# Patient Record
Sex: Male | Born: 1945 | Race: White | Hispanic: No | Marital: Married | Smoking: Former smoker
Health system: Southern US, Community
[De-identification: ages and names within clinical notes are randomized; demographics above are authoritative.]

## PROBLEM LIST (undated history)

## (undated) DIAGNOSIS — I10 Essential (primary) hypertension: Secondary | ICD-10-CM

## (undated) DIAGNOSIS — E119 Type 2 diabetes mellitus without complications: Secondary | ICD-10-CM

## (undated) DIAGNOSIS — I219 Acute myocardial infarction, unspecified: Secondary | ICD-10-CM

## (undated) DIAGNOSIS — G629 Polyneuropathy, unspecified: Secondary | ICD-10-CM

## (undated) HISTORY — PX: CORONARY ANGIOPLASTY WITH STENT PLACEMENT: SHX49

## (undated) HISTORY — PX: ROTATOR CUFF REPAIR: SHX139

## (undated) HISTORY — PX: BACK SURGERY: SHX140

---

## 2008-12-06 ENCOUNTER — Emergency Department (HOSPITAL_COMMUNITY): Admission: EM | Admit: 2008-12-06 | Discharge: 2008-12-06 | Payer: Self-pay | Admitting: Emergency Medicine

## 2008-12-06 ENCOUNTER — Ambulatory Visit (HOSPITAL_COMMUNITY): Admission: RE | Admit: 2008-12-06 | Discharge: 2008-12-06 | Payer: Self-pay | Admitting: Orthopedic Surgery

## 2008-12-27 ENCOUNTER — Inpatient Hospital Stay (HOSPITAL_COMMUNITY): Admission: RE | Admit: 2008-12-27 | Discharge: 2009-01-01 | Payer: Self-pay | Admitting: Orthopedic Surgery

## 2010-02-02 IMAGING — CR DG LUMBAR SPINE 2-3V
1 series · 1 of 1 positions shown · non-contrast
Comparison: 11/29/2008

CLINICAL DATA: Lumbar spinal stenosis, L3-L5 decompression

LUMBAR SPINE - 2-3 VIEW

[view not recorded]
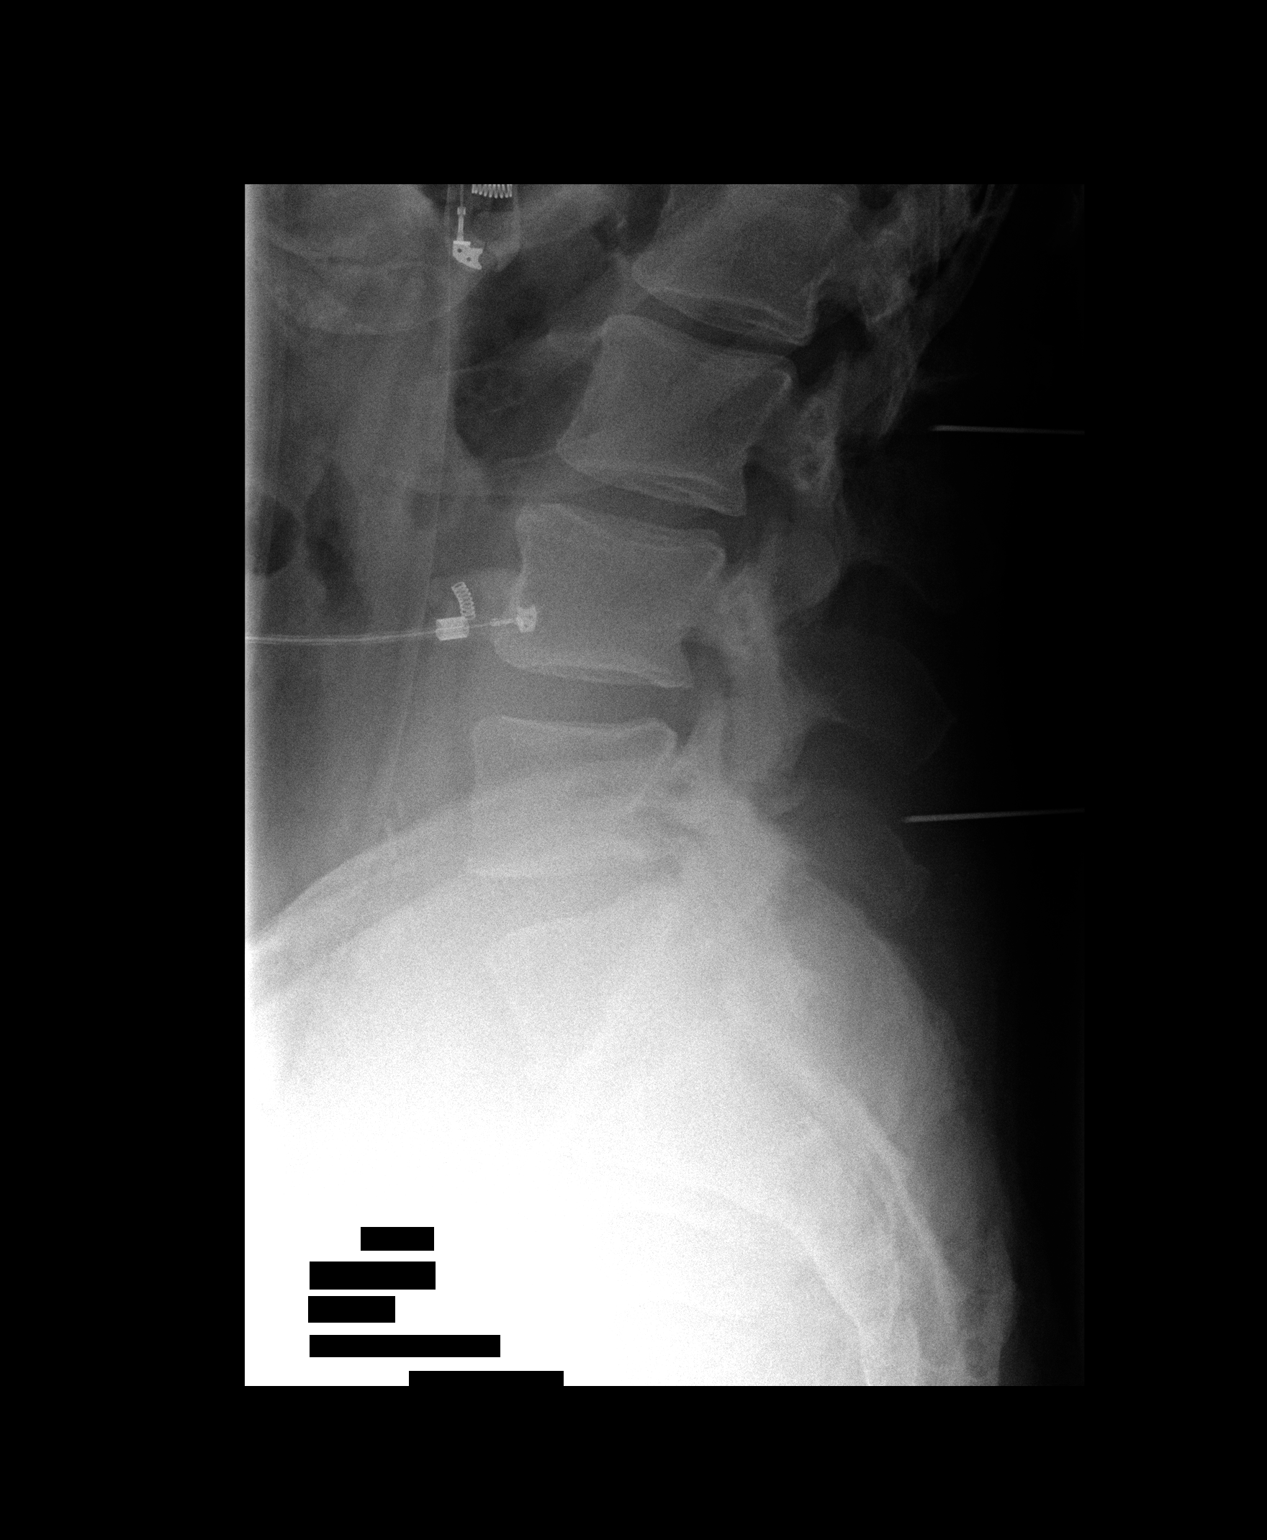

[1 of 1 positions shown; findings below may reference images not displayed]

FINDINGS: Cross-table lateral intraoperative views were obtained.
Film #1 demonstrates radiopaque localizer needles posterior to L2
and L4.  Film #2 demonstrates a surgical gauze posterior to L4-5
and a radiopaque localizer posterior to L3-4
IMPRESSION: Lumbar spine intraoperative localization

## 2010-10-13 LAB — TYPE AND SCREEN: ABO/RH(D): B POS

## 2010-10-13 LAB — GLUCOSE, CAPILLARY
Glucose-Capillary: 100 mg/dL — ABNORMAL HIGH (ref 70–99)
Glucose-Capillary: 100 mg/dL — ABNORMAL HIGH (ref 70–99)
Glucose-Capillary: 107 mg/dL — ABNORMAL HIGH (ref 70–99)
Glucose-Capillary: 107 mg/dL — ABNORMAL HIGH (ref 70–99)
Glucose-Capillary: 115 mg/dL — ABNORMAL HIGH (ref 70–99)
Glucose-Capillary: 120 mg/dL — ABNORMAL HIGH (ref 70–99)
Glucose-Capillary: 127 mg/dL — ABNORMAL HIGH (ref 70–99)
Glucose-Capillary: 137 mg/dL — ABNORMAL HIGH (ref 70–99)
Glucose-Capillary: 97 mg/dL (ref 70–99)
Glucose-Capillary: 99 mg/dL (ref 70–99)
Glucose-Capillary: 99 mg/dL (ref 70–99)

## 2010-10-13 LAB — CBC
HCT: 40.6 % (ref 39.0–52.0)
Hemoglobin: 13.5 g/dL (ref 13.0–17.0)
MCV: 88 fL (ref 78.0–100.0)
Platelets: 186 10*3/uL (ref 150–400)
WBC: 5.7 10*3/uL (ref 4.0–10.5)

## 2010-10-13 LAB — BASIC METABOLIC PANEL
BUN: 11 mg/dL (ref 6–23)
Chloride: 110 mEq/L (ref 96–112)
Glucose, Bld: 106 mg/dL — ABNORMAL HIGH (ref 70–99)
Potassium: 4.4 mEq/L (ref 3.5–5.1)
Sodium: 142 mEq/L (ref 135–145)

## 2010-10-13 LAB — ABO/RH: ABO/RH(D): B POS

## 2010-10-14 LAB — BASIC METABOLIC PANEL
BUN: 11 mg/dL (ref 6–23)
CO2: 29 mEq/L (ref 19–32)
Chloride: 105 mEq/L (ref 96–112)
Creatinine, Ser: 0.95 mg/dL (ref 0.4–1.5)
Glucose, Bld: 97 mg/dL (ref 70–99)

## 2010-10-14 LAB — CBC
HCT: 41.2 % (ref 39.0–52.0)
Hemoglobin: 13.9 g/dL (ref 13.0–17.0)
MCHC: 33.7 g/dL (ref 30.0–36.0)
MCV: 86.8 fL (ref 78.0–100.0)
Platelets: 187 10*3/uL (ref 150–400)
RBC: 4.74 MIL/uL (ref 4.22–5.81)
RDW: 14.3 % (ref 11.5–15.5)
WBC: 6.5 10*3/uL (ref 4.0–10.5)

## 2010-11-18 NOTE — Op Note (Signed)
Patrick Berger, Patrick Berger                  ACCOUNT NO.:  [id number]   MEDICAL RECORD NO.:  [id number]          PATIENT TYPE:  INP   LOCATION:  5008                         FACILITY:  [facility]   PHYSICIAN:  Alvy Beal, MD    DATE OF BIRTH:  1946-04-12   DATE OF PROCEDURE:  12/27/2008  DATE OF DISCHARGE:                               OPERATIVE REPORT   PREOPERATIVE DIAGNOSIS:  Lumbar spinal stenosis with neurogenic  claudication, L3-4 and L4-5.   POSTOPERATIVE DIAGNOSIS:  Lumbar spinal stenosis with neurogenic  claudication, L3-4 and L4-5.   OPERATIVE PROCEDURE:  Lumbar 3 to lumbar 5 decompression and  foraminotomy with uninstrumented arthrodesis, L3 to 5.   COMPLICATIONS:  None.   CONDITION:  Stable.   SURGEON:  Dahari D. 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  Clinical  and radiographic analysis confirmed the diagnosis of lumber spinal  stenosis, specially in the lateral recess causing foraminal and nerve  root encroachment.  Attempts at conservative management have failed to  alleviate his symptoms.  As such surgical management was discussed.  All  appropriate risks, benefits, and alternatives were reviewed and he  consented to the aforementioned procedure.   OPERATIVE NOTE:  The patient was brought to the operating room, placed  supine on the operating table.  After successful induction of general  anesthesia and endotracheal intubation, TEDs, SCD, and Foley were  inserted and the patient was turned prone onto a Wilson frame.   All bony prominences were well-padded.  Appropriate preoperative  surgical time-out was done confirming patient and procedure.  The back  was prepped and draped in a standard fashion.  Two needles were placed  and x-ray was taken to mark the skin incision.  A generous posterior  midline incision was made  starting about around the levels of inferior  aspect of L2 expanding down to S1.  Sharp dissection was carried out  through the adipose tissue down to the deep fascia.  Deep fascia was  sharply incised in line with skin incision and using a Bovie and Cobb  elevator, I stripped the paraspinal muscles to expose the entire L3, L4,  and L5 spinous processes and lamina.  Once I had bilateral exposure, I  then placed a Penfield 4 underneath the lamina of L3 and took a second  intraoperative x-ray.  This confirmed that I was at the appropriate  level.  Once this was confirmed, I then proceeded with my central  decompression.  I removed the entire spinous process of L5 and L4 and  the majority of that L3.  Using a double XL rongeur, I also began  resecting the lamina of L5 and L4.  Once I removed the majority of the  bone with the Leksell rongeur, I then turned my attention to the  curettes.   A fine curette was used to develop plane underneath the remaining  portion of the lamina.  A 0.5  x 0.5 neural patty was then placed in  order to protect the underlying thecal sac.  Using a 2 and 3 mm Kerrison  rongeur, I resected the remainder of the L5 lamina.  Once I had a  complete laminectomy, I then proceeded into the lateral gutter.  Since I  was standing on the right hand side of the patient, I was proceeding  into the left and looking down the contralateral side.  I resected all  the significant osteophytes from the facet complex until I could palpate  and visualize the medial wall pedicle.  I then proceeded down for  generous foraminotomy with my 2-mm Kerrison.  At this point, I then  continued to move proximally.  Again using a Penfield 4, I developed a  plane between the thecal sac and the ligamentum flavum.  Now, I passed a  neural patty in this plane to protect the thecal sac.  Using the 3-mm  Kerrison, I completed the L4 laminectomy.  I then carried my dissection  into the lateral recess.   There were significant lateral recess  stenosis, thickening of the ligamentum flavum.  I was able to use the  Penfields 4 to dissect and develop a plane between the thecal sac and  the degenerative bone spurs.  Using a 2 and 3-mm Kerrison, I resected  all these until I could see again the medial wall of the L4 pedicle.  I  then used a 2-mm Kerrison to perform a foraminotomy.  I then repeated  this procedure at the L3 level, but at this time, performing on the  partial laminotomy.  Once I had the lateral recess decompress from the  superior aspect of the L3 pedicle down through the L5 pedicle.  I was  pleased with the central and left lateral decompression.  I could  clearly and easily use a hockey-stick dural elevator and pass it along  the lateral recess superiorly, medially, and inferiorly about the L3, L4  and L5 neural foramen.  There was no undo tension or ongoing significant  neural compression.  At this point, I went to the contralateral side of  the table, and then began doing the same lateral recess decompression,  but this time on the opposite side.  I found similar findings of severe  spinal stenosis with lateral recess compromise to the bone spur  formation, all which was resected with the 2 and 3-mm Kerrison.  At this  point, once the decompression was completed, I had the Wilson frame  reduced down and the local kyphosis removed, so that I can ensure that  there was no undue tension.  Again even with the Wilson frame completely  flat, I was still able to freely pass the dural elevator superiorly and  along the lateral recess and out at the L3, L4, and L5 neural foramen  without difficulty.  At this point, I was concerned because of the  extensive decompression that was done.  I then dissected out over the 2-  3, 3-4, and 4-5 facet complexes to expose the L3, L4, and L5 transverse  process.  I decorticated them with high-speed bur and then using a local  bone that I had  harvested from the decompression mixed with Actifuse, I  performed a posterolateral arthrodesis.   At this point, I irrigated the wound copiously with normal saline and  checked again to ensure my decompression was adequate.  I then placed  thrombin-soaked Gelfoam patty over the exposed thecal sac  and I placed  the drain in deep fascia.  I then closed the deep fascia with  interrupted #1 Vicryl sutures, then a 0 running Vicryl sutures layer, a  2-0 Vicryl suture layer and then 3-0 Monocryl for the skin.  Steri-  Strips and a dry dressing were applied.  The patient was extubated and  transferred to PACU without incident.  At the end of the case, all  needle and sponge counts were correct.      Alvy Beal, MD  Electronically Signed     DDB/MEDQ  D:  12/27/2008  T:  12/28/2008  Job:  [id number]

## 2010-11-21 NOTE — Discharge Summary (Signed)
Patrick Berger, Patrick Berger                  ACCOUNT NO.:  [id number]   MEDICAL RECORD NO.:  [id number]          PATIENT TYPE:  INP   LOCATION:  5008                         FACILITY:  [facility]   PHYSICIAN:  Alvy Beal, MD    DATE OF BIRTH:  03-Nov-1945   DATE OF ADMISSION:  12/27/2008  DATE OF DISCHARGE:  01/01/2009                               DISCHARGE SUMMARY   ADMISSION DIAGNOSIS:  Lumbar spinal stenosis with neurogenic  claudication at L3-4 and L4-5.   DISCHARGE DIAGNOSIS:  Lumbar spinal stenosis with neurogenic  claudication at L3-4 and L4-5.   OPERATIVE PROCEDURE:  On December 27, 2008 was an L3-L5 lumbar decompression  and foraminotomy with un-instrumented arthrodesis of L3-L5.   CONSULTATIONS OBTAINED:  None.   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  Clinical and radiographic analysis confirmed the  diagnosis of a lumbar spinal stenosis specifically in the lateral recess  causing foraminal and nerve root encroachment.  All attempts at  conservative management have failed including injection therapy,  physical therapy and nonnarcotic and narcotic medications.  At that  point, a surgical management was discussed with the patient.  All  appropriate risks, benefits and alternatives were reviewed with the  patient and the patient did consent to a L3-L5 lumbar decompression.   HOSPITAL COURSE:  The patient's hospital course was approximately 5 days  in length.  The patient tolerated the procedure very well and was  subsequently transferred from the PACU up to the orthopedic floor in  stable condition.  Postoperatively day #1, the patient was making a  positive gain.  His pain was controlled on the PCA and he began to work  with physical therapy.  Postoperatively day #2, the patient was doing  well, however, he did have a mild low-grade fever of 100 degrees,  therefore, his discharge was held and he was begun on incentive  spirometry.  He still continued to make positive gains with physical  therapy.  He was ambulating well with a rolling walker.  He was  tolerating a regular diet and had no other complaints.  The patient  continued to make positive gains while in the hospital.  He was working  making improvement everyday with physical therapy.  Postoperatively day  #5, the patient was afebrile.  He was tolerating a regular diet.  He was  having no complaints of shortness of breath and/or chest pain.  His  abdomen was soft and nontender.  He was having regular bowel and bladder  movements.  All his compartments were soft and nontender.  Therefore, he  was deemed stable to be discharged home with [facility].   DISCHARGE MEDICATIONS:  The patient is being discharged home on his  current medications of:  1. Simvastatin 40 mg 1 tablet daily.  2. Metoprolol 50 mg 1 tablet daily.  3. Metformin 500 mg 1 tablet b.i.d.  4. Multivitamin daily.  5. Aspirin 81 mg.  6.  The patient is being discharged home on a new medication of      Percocet 10/325 one tablet p.o. q.6 h. p.r.n. pain as well as      Robaxin 500 mg 1 tablet p.o. every 8 hours as needed for pain.   DISCHARGE VITALS:  Temperature of 98.6, pulse of 76, blood pressure of  136/84 and he was satting 98% on room air.   DISCHARGE INSTRUCTIONS:  The patient was given a preprinted discharge  instruction form that went over with specifics what the patient can and  cannot do at home.  The patient is allowed to increase his activity  slowly.  We would like the patient walking everyday as much as  tolerated.  He may walk up stairs as needed.  He is to no lifting for 6  weeks and no driving for 6 weeks.  He may shower on postoperatively day  #5, needs to change his dressing daily for the next 7 days.  The patient  is instructed to follow up with Dr. Shon Baton in our office at [phone]  in  2 weeks for a suture removal.  The patient is to call our office at [phone] if he experiences any fevers, chills, redness or pain around the  incision site, he notes any drainage from the incision site, he has  increasing leg pain or loss of bowel or bladder function.  The patient  is being discharged home in stable condition with [facility].      Crissie Reese, PA      Alvy Beal, MD  Electronically Signed    AC/MEDQ  D:  02/18/2009  T:  02/19/2009  Job:  [id number]

## 2016-10-08 ENCOUNTER — Other Ambulatory Visit (HOSPITAL_COMMUNITY): Payer: Self-pay | Admitting: Interventional Radiology

## 2019-05-14 ENCOUNTER — Encounter (HOSPITAL_COMMUNITY): Payer: Self-pay | Admitting: Oncology

## 2019-05-14 ENCOUNTER — Emergency Department (HOSPITAL_COMMUNITY)
Admission: EM | Admit: 2019-05-14 | Discharge: 2019-05-14 | Disposition: A | Discharge status: Home / Self Care | Payer: No Typology Code available for payment source | Attending: Emergency Medicine | Admitting: Emergency Medicine

## 2019-05-14 ENCOUNTER — Other Ambulatory Visit: Payer: Self-pay

## 2019-05-14 ENCOUNTER — Emergency Department (HOSPITAL_COMMUNITY): Payer: No Typology Code available for payment source

## 2019-05-14 DIAGNOSIS — R4701 Aphasia: Secondary | ICD-10-CM | POA: Diagnosis not present

## 2019-05-14 DIAGNOSIS — Z87891 Personal history of nicotine dependence: Secondary | ICD-10-CM | POA: Insufficient documentation

## 2019-05-14 DIAGNOSIS — I10 Essential (primary) hypertension: Secondary | ICD-10-CM | POA: Diagnosis not present

## 2019-05-14 DIAGNOSIS — I639 Cerebral infarction, unspecified: Secondary | ICD-10-CM

## 2019-05-14 DIAGNOSIS — E119 Type 2 diabetes mellitus without complications: Secondary | ICD-10-CM | POA: Diagnosis not present

## 2019-05-14 HISTORY — DX: Polyneuropathy, unspecified: G62.9

## 2019-05-14 HISTORY — DX: Type 2 diabetes mellitus without complications: E11.9

## 2019-05-14 HISTORY — DX: Acute myocardial infarction, unspecified: I21.9

## 2019-05-14 HISTORY — DX: Essential (primary) hypertension: I10

## 2019-05-14 LAB — I-STAT CHEM 8, ED
BUN: 15 mg/dL (ref 8–23)
Calcium, Ion: 1.23 mmol/L (ref 1.15–1.40)
Chloride: 105 mmol/L (ref 98–111)
Creatinine, Ser: 1 mg/dL (ref 0.61–1.24)
Glucose, Bld: 125 mg/dL — ABNORMAL HIGH (ref 70–99)
HCT: 40 % (ref 39.0–52.0)
Hemoglobin: 13.6 g/dL (ref 13.0–17.0)
Potassium: 3.8 mmol/L (ref 3.5–5.1)
Sodium: 141 mmol/L (ref 135–145)
TCO2: 25 mmol/L (ref 22–32)

## 2019-05-14 LAB — CBC
HCT: 40.2 % (ref 39.0–52.0)
Hemoglobin: 13.2 g/dL (ref 13.0–17.0)
MCH: 29.5 pg (ref 26.0–34.0)
MCHC: 32.8 g/dL (ref 30.0–36.0)
MCV: 89.9 fL (ref 80.0–100.0)
Platelets: 181 10*3/uL (ref 150–400)
RBC: 4.47 MIL/uL (ref 4.22–5.81)
RDW: 13.5 % (ref 11.5–15.5)
WBC: 6.7 10*3/uL (ref 4.0–10.5)
nRBC: 0 % (ref 0.0–0.2)

## 2019-05-14 LAB — DIFFERENTIAL
Abs Immature Granulocytes: 0.02 10*3/uL (ref 0.00–0.07)
Basophils Absolute: 0 10*3/uL (ref 0.0–0.1)
Basophils Relative: 0 %
Eosinophils Absolute: 0.1 10*3/uL (ref 0.0–0.5)
Eosinophils Relative: 2 %
Immature Granulocytes: 0 %
Lymphocytes Relative: 23 %
Lymphs Abs: 1.6 10*3/uL (ref 0.7–4.0)
Monocytes Absolute: 0.5 10*3/uL (ref 0.1–1.0)
Monocytes Relative: 7 %
Neutro Abs: 4.5 10*3/uL (ref 1.7–7.7)
Neutrophils Relative %: 68 %

## 2019-05-14 LAB — COMPREHENSIVE METABOLIC PANEL
ALT: 36 U/L (ref 0–44)
AST: 29 U/L (ref 15–41)
Albumin: 3.8 g/dL (ref 3.5–5.0)
Alkaline Phosphatase: 33 U/L — ABNORMAL LOW (ref 38–126)
Anion gap: 12 (ref 5–15)
BUN: 14 mg/dL (ref 8–23)
CO2: 22 mmol/L (ref 22–32)
Calcium: 9.4 mg/dL (ref 8.9–10.3)
Chloride: 105 mmol/L (ref 98–111)
Creatinine, Ser: 1.14 mg/dL (ref 0.61–1.24)
GFR calc Af Amer: >60 mL/min (ref >60)
GFR calc non Af Amer: >60 mL/min (ref >60)
Glucose, Bld: 130 mg/dL — ABNORMAL HIGH (ref 70–99)
Potassium: 3.8 mmol/L (ref 3.5–5.1)
Sodium: 139 mmol/L (ref 135–145)
Total Bilirubin: 0.6 mg/dL (ref 0.3–1.2)
Total Protein: 6.5 g/dL (ref 6.5–8.1)

## 2019-05-14 LAB — PROTIME-INR
INR: 1.1 (ref 0.8–1.2)
Prothrombin Time: 13.8 seconds (ref 11.4–15.2)

## 2019-05-14 LAB — CBG MONITORING, ED: Glucose-Capillary: 112 mg/dL — ABNORMAL HIGH (ref 70–99)

## 2019-05-14 LAB — APTT: aPTT: 21 seconds — ABNORMAL LOW (ref 24–36)

## 2019-05-14 MED ORDER — SODIUM CHLORIDE 0.9% FLUSH
3.0000 mL | Freq: Once | INTRAVENOUS | Status: DC
Start: 2019-05-14 — End: 2019-05-14

## 2019-05-14 NOTE — ED Notes (Signed)
Pts wet brief changed and helped pt use urianl before placing new incontinent brief

## 2019-05-14 NOTE — ED Provider Notes (Signed)
[facility] Note  CSN: [id number] Arrival date & time: 05/14/19 0131  Chief Complaint(s) Code Stroke  HPI Patrick Berger is a 73 y.o. male with a history of hypertension and diabetes who presents to the emergency department with trouble speaking.  Patient brought in by EMS for possible code stroke.  No unilateral weakness, facial droop or visual disturbance.  Apparently patient woke up from a nap and was unable to talk so his girlfriend called EMS.  Remainder of history, ROS, and physical exam limited due to patient's condition (nonverbal). Additional information was obtained from EMS.   Level V Caveat.    HPI  Past Medical History Past Medical History:  Diagnosis Date   Diabetes mellitus without complication (HCC)    Hypertension    MI (myocardial infarction) (HCC)    Neuropathy    There are no active problems to display for this patient.  Home Medication(s) Prior to Admission medications   Not on File                                                                                                                                    Past Surgical History Past Surgical History:  Procedure Laterality Date   BACK SURGERY     CORONARY ANGIOPLASTY WITH STENT PLACEMENT     ROTATOR CUFF REPAIR Left    Family History No family history on file.  Social History Social History   Tobacco Use   Smoking status: Former Smoker   Smokeless tobacco: Never Used  Substance Use Topics   Alcohol use: Not Currently   Drug use: Not Currently   Allergies Patient has no known allergies.  Review of Systems Review of Systems All other systems are reviewed and are negative for acute change except as noted in the HPI  Physical Exam Vital Signs  I have reviewed the triage vital signs BP 110/63    Pulse (!) 55    Temp 97.8 F (36.6 C) (Oral)    Resp 16    Ht 5\' 11"  (1.803 m)    Wt 109.9 kg    SpO2 97%    BMI 33.79 kg/m    Physical Exam Vitals signs reviewed.  Constitutional:      General: He is not in acute distress.    Appearance: He is well-developed. He is not diaphoretic.  HENT:     Head: Normocephalic and atraumatic.     Nose: Nose normal.  Eyes:     General: No scleral icterus.       Right eye: No discharge.        Left eye: No discharge.     Conjunctiva/sclera: Conjunctivae normal.     Pupils: Pupils are equal, round, and reactive to light.  Neck:     Musculoskeletal: Normal range of motion and neck supple.  Cardiovascular:     Rate and Rhythm: Normal rate and regular  rhythm.     Heart sounds: No murmur. No friction rub. No gallop.   Pulmonary:     Effort: Pulmonary effort is normal. No respiratory distress.     Breath sounds: Normal breath sounds. No stridor. No rales.  Abdominal:     General: There is no distension.     Palpations: Abdomen is soft.     Tenderness: There is no abdominal tenderness.  Musculoskeletal:        General: No tenderness.  Skin:    General: Skin is warm and dry.     Findings: No erythema or rash.  Neurological:     Mental Status: He is alert.     Comments: Nonverbal Cranial nerves II through XII intact Follows commands Moves all extremities with good strength Sensation intact  Detailed neuro exam by neurologist     ED Results and Treatments Labs (all labs ordered are listed, but only abnormal results are displayed) Labs Reviewed  APTT - Abnormal; Notable for the following components:      Result Value   aPTT 21 (*)    All other components within normal limits  COMPREHENSIVE METABOLIC PANEL - Abnormal; Notable for the following components:   Glucose, Bld 130 (*)    Alkaline Phosphatase 33 (*)    All other components within normal limits  I-STAT CHEM 8, ED - Abnormal; Notable for the following components:   Glucose, Bld 125 (*)    All other components within normal limits  CBG MONITORING, ED - Abnormal; Notable for the following components:    Glucose-Capillary 112 (*)    All other components within normal limits  PROTIME-INR  CBC  DIFFERENTIAL                                                                                                                         EKG  EKG Interpretation  Date/Time:    Ventricular Rate:    PR Interval:    QRS Duration:   QT Interval:    QTC Calculation:   R Axis:     Text Interpretation:        Radiology Mr Brain Wo Contrast  Result Date: 05/14/2019 CLINICAL DATA:  Initial evaluation for acute aphasia. EXAM: MRI HEAD WITHOUT CONTRAST TECHNIQUE: Multiplanar, multiecho pulse sequences of the brain and surrounding structures were obtained without intravenous contrast. COMPARISON:  Prior CT from earlier the same day. FINDINGS: Brain: Mild age-related cerebral atrophy. Patchy and confluent T2/FLAIR hyperintensity within the periventricular and deep white matter both cerebral hemispheres most consistent with chronic small vessel ischemic disease, mild for age. No abnormal foci of restricted diffusion to suggest acute or subacute ischemia. Gray-white matter differentiation well maintained. No encephalomalacia to suggest chronic infarction. No foci of susceptibility artifact to suggest acute or chronic intracranial hemorrhage. No mass lesion, midline shift or mass effect. No hydrocephalus. No extra-axial fluid collection. Major dural sinuses are grossly patent. Pituitary gland and suprasellar region are normal. Midline structures intact and normal. Vascular:  Major intracranial vascular flow voids well maintained and normal in appearance. Skull and upper cervical spine: Craniocervical junction normal. Visualized upper cervical spine within normal limits. Bone marrow signal intensity normal. No scalp soft tissue abnormality. Sinuses/Orbits: Globes and orbital soft tissues within normal limits. Paranasal sinuses are clear. No mastoid effusion. Inner ear structures normal. Other: None. IMPRESSION: 1. No acute  intracranial abnormality. 2. Mild age-related cerebral atrophy with chronic small vessel ischemic disease. Electronically Signed   By: Rise Mu M.D.   On: 05/14/2019 03:10   Ct Head Code Stroke Wo Contrast  Result Date: 05/14/2019 CLINICAL DATA:  Code stroke. Initial evaluation for acute aphasia, possible stroke. EXAM: CT HEAD WITHOUT CONTRAST TECHNIQUE: Contiguous axial images were obtained from the base of the skull through the vertex without intravenous contrast. COMPARISON:  Prior brain MRI from 07/18/2016. FINDINGS: Brain: Generalized age-related cerebral atrophy with mild chronic small vessel ischemic disease. No acute intracranial hemorrhage. No acute large vessel territory infarct. No mass lesion, midline shift or mass effect. No hydrocephalus. No extra-axial fluid collection. Vascular: No hyperdense vessel. Scattered vascular calcifications noted within the carotid siphons. Skull: Scalp soft tissues and calvarium within normal limits. Sinuses/Orbits: Globes and orbital soft tissues demonstrate no acute finding. Paranasal sinuses are largely clear. No mastoid effusion. Other: None. ASPECTS [facility] Stroke Program Early CT Score) - Ganglionic level infarction (caudate, lentiform nuclei, internal capsule, insula, M1-M3 cortex): 7 - Supraganglionic infarction (M4-M6 cortex): 3 Total score (0-10 with 10 being normal): 10 IMPRESSION: 1. No acute intracranial infarct or other abnormality identified. 2. ASPECTS is 10. 3. Age-related cerebral atrophy with mild chronic small vessel ischemic disease. These results were communicated to Dr. Otelia Limes at 1:54 amon 11/8/2020by text page via the [facility] messaging system. Electronically Signed   By: Rise Mu M.D.   On: 05/14/2019 01:55    Pertinent labs & imaging results that were available during my care of the patient were reviewed by me and considered in my medical decision making (see chart for details).  Medications Ordered in  ED Medications  sodium chloride flush (NS) 0.9 % injection 3 mL (has no administration in time range)                                                                                                                                    Procedures Procedures  (including critical care time)  Medical Decision Making / ED Course I have reviewed the nursing notes for this encounter and the patient's prior records (if available in EHR or on provided paperwork).   Patrick Berger was evaluated in Emergency Department on 05/14/2019 for the symptoms described in the history of present illness. He was evaluated in the context of the global COVID-19 pandemic, which necessitated consideration that the patient might be at risk for infection with the SARS-CoV-2 virus that causes COVID-19. Institutional protocols and algorithms that pertain to the evaluation of patients at risk  for COVID-19 are in a state of rapid change based on information released by regulatory bodies including the CDC and federal and state organizations. These policies and algorithms were followed during the patient's care in the ED.  Taken immediately to CT scan and then MRI.  Stroke ruled out.  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  Screening labs all reassuring.  Patient's ability to speak return.  Now back to baseline.  The patient appears reasonably screened and/or stabilized for discharge and I doubt any other medical condition or other [facility] requiring further screening, evaluation, or treatment in the ED at this time prior to discharge.  The patient is safe for discharge with strict return precautions.       Final Clinical Impression(s) / ED Diagnoses Final diagnoses:  Aphasia     The patient appears reasonably screened and/or stabilized for discharge and I doubt any other medical condition or other [facility] requiring further screening, evaluation, or treatment in the ED at this time prior to discharge.  Disposition:  Discharge  Condition: Good  I have discussed the results, Dx and Tx plan with the patient who expressed understanding and agree(s) with the plan. Discharge instructions discussed at great length. The patient was given strict return precautions who verbalized understanding of the instructions. No further questions at time of discharge.    ED Discharge Orders    None       Follow Up: Primary care provider  Schedule an appointment as soon as possible for a visit       This chart was dictated using voice recognition software.  Despite best efforts to proofread,  errors can occur which can change the documentation meaning.   Fatima Blank, MD 05/14/19 [phone]

## 2019-05-14 NOTE — Consult Note (Signed)
Referring Physician: Dr. Eudelia Bunch    Chief Complaint: Acute onset of aphasia  HPI: Patrick Berger is an 73 y.o. male with a PMHx of DVT, PE, DM2, HTN, GBS, CAD and neuropathy. He presents acutely to the ED after acute onset of speech deficit was noted by family at home. LKN was 1900. Family at some point decided to call EMS. He arrived in the ED with fluctuating hypophonic speech pattern but with normal grammar/syntax. Speech barely audible at times, but nondysarthric. Noted to not have any lateralized weakness, but with slow, labored movement of upper and lower extremities appearing suggestive of diffuse weakness versus poor effort.   CT head was negative for acute infarction.   LSN: 1900 tPA Given: No: Out of tPA time window.   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 Social History:  has no history on file for tobacco, alcohol, and drug.  Allergies: Not on File  Home Medications:  No home medications listed in Epic  ROS: Deferred in the context of acuity of presentation and labored, slow speech  Physical Examination: There were no vitals taken for this visit.  HEENT: [place]/AT Lungs: Respirations unlabored Ext: Edema to distal BLE  Neurologic Examination: Mental Status: Awake. Stares straight ahead. Speaks with barely audible hypophonic speech and short answers to questions. When asked  questions requiring longer responses, speech is grammatical and nondysarthric. Produces an unusual "squeaking" sound when speaking hypophonically. Level of hypophonia changes significantly with distraction, becoming significantly louder when he responds to questions from RN. Able to follow all motor commands and answer questions regarding symptoms.  Cranial Nerves: II:  Visual fields intact bilaterally. PERRL.   III,IV, VI: No ptosis. EOMI with saccadic pursuits noted.  V,VII: Smile symmetric, reacts to touch bilaterally  VIII: hearing intact to voice IX,X: Hypophonic speech XI: Symmetric XII: midline tongue extension  Motor: BUE with slow, labored movements requiring good fine motor control during performance of FNF. Grips examiner's hands with 4/5 strength bilaterally at maximum, improved with coaching. Coaching-dependent BUE flexion and extension at elbow, as well as extension at deltoids, with max strength elicited 4/5. BLE with max strength elicited of 3/5 HF and 4/5 KE on right, 2/5 HF and 4-/5 KE on left. Does not ADF or APF to command.  States that there is low back pain when passively elevating LLE.  Sensory: Subjectively intact BUE to FT. Decreased FT bilateral LE.  Deep Tendon Reflexes:  Hypoactive bilateral upper and lower extremities.  Plantars: Right: downgoing   Left: Upgoing Cerebellar: No ataxia with FNF bilaterally. Slow, labored movement with strained affect during testing.  Gait: Deferred due to acuity of presentation.    Results for orders placed or performed during the hospital encounter of 05/14/19 (from the past 48 hour(s))  Protime-INR     Status: None   Collection Time: 05/14/19  1:40 AM  Result Value Ref Range   Prothrombin Time 13.8  11.4 - 15.2 seconds   INR 1.1 0.8 - 1.2    Comment: (NOTE) INR goal varies based on device and disease states. Performed at [facility], [address] [place], [place] [address]   APTT     Status: Abnormal    Collection Time: 05/14/19  1:40 AM  Result Value Ref Range   aPTT 21 (L) 24 - 36 seconds    Comment: Performed at [facility], [address] [place], [place] [address]  CBC     Status: None   Collection Time: 05/14/19  1:40 AM  Result Value Ref Range   WBC 6.7 4.0 - 10.5 K/uL   RBC 4.47 4.22 - 5.81 MIL/uL   Hemoglobin 13.2 13.0 - 17.0 g/dL   HCT 09.840.2 11.939.0 - 14.752.0 %   MCV 89.9 80.0 - 100.0 fL   MCH 29.5 26.0 - 34.0 pg   MCHC 32.8 30.0 - 36.0 g/dL   RDW 82.913.5 56.211.5 - 13.015.5 %   Platelets 181 150 - 400 K/uL   nRBC 0.0 0.0 - 0.2 %    Comment: Performed at [facility], [address] [place], [place] [address]  Differential     Status: None   Collection Time: 05/14/19  1:40 AM  Result Value Ref Range   Neutrophils Relative % 68 %   Neutro Abs 4.5 1.7 - 7.7 K/uL   Lymphocytes Relative 23 %   Lymphs Abs 1.6 0.7 - 4.0 K/uL   Monocytes Relative 7 %   Monocytes Absolute 0.5 0.1 - 1.0 K/uL   Eosinophils Relative 2 %   Eosinophils Absolute 0.1 0.0 - 0.5 K/uL   Basophils Relative 0 %   Basophils Absolute 0.0 0.0 - 0.1 K/uL   Immature Granulocytes 0 %   Abs Immature Granulocytes 0.02 0.00 - 0.07 K/uL    Comment: Performed at [facility], [address] [place], [place] [address]  I-stat chem 8, ED     Status: Abnormal   Collection Time: 05/14/19  1:51 AM  Result Value Ref Range   Sodium 141 135 - 145 mmol/L   Potassium 3.8 3.5 - 5.1 mmol/L   Chloride 105 98 - 111 mmol/L   BUN 15 8 - 23 mg/dL   Creatinine, Ser 9.521.00 0.61 - 1.24 mg/dL   Glucose, Bld 841125 (H) 70 - 99 mg/dL   Calcium, Ion 3.241.23 4.011.15 - 1.40 mmol/L   TCO2 25 22 - 32 mmol/L   Hemoglobin 13.6 13.0 - 17.0 g/dL   HCT 02.740.0 25.339.0 - 66.452.0 %   Ct Head Code Stroke Wo Contrast  Result Date: 05/14/2019 CLINICAL DATA:  Code stroke. Initial evaluation for acute aphasia, possible stroke. EXAM: CT HEAD WITHOUT CONTRAST TECHNIQUE: Contiguous axial images were obtained from the base of the skull through the vertex without  intravenous contrast. COMPARISON:  Prior brain MRI from 07/18/2016. FINDINGS: Brain: Generalized age-related cerebral atrophy with mild chronic small vessel ischemic disease. No acute intracranial hemorrhage. No acute large vessel territory infarct. No mass lesion, midline shift or mass effect. No hydrocephalus. No extra-axial fluid collection. Vascular: No hyperdense vessel. Scattered vascular calcifications noted within the carotid siphons. Skull: Scalp soft tissues and calvarium within normal limits. Sinuses/Orbits: Globes and orbital soft tissues demonstrate no acute finding. Paranasal sinuses are largely clear. No mastoid effusion. Other: None. ASPECTS [facility]([place] Stroke Program Early CT Score) - Ganglionic level infarction (caudate, lentiform nuclei, internal capsule, insula, M1-M3 cortex): 7 - Supraganglionic infarction (M4-M6  cortex): 3 Total score (0-10 with 10 being normal): 10 IMPRESSION: 1. No acute intracranial infarct or other abnormality identified. 2. ASPECTS is 10. 3. Age-related cerebral atrophy with mild chronic small vessel ischemic disease. These results were communicated to Dr. Cheral Marker at [place] 11/8/2020by text page via the [facility] messaging system. Electronically Signed   By: Jeannine Boga M.D.   On: 05/14/2019 01:55    Assessment: 73 y.o. male presenting with acute onset of atypical aphasic speech pattern 1. CT head negative for acute abnormality 2. MRI brain shows no acute infarction while still symptomatic.  3. Overall presentation most c/w psychogenic pseudostroke. Given functional/embellished/fluctuating quality of speech on neurological exam in conjunction with negative MRI brain 4. Stroke Risk Factors -  History of DVT, DM2, HTN and CAD    Plan: 1. Monitor patient clinically 2. Consider psychology evaluation for possible psychosocial stressors at home 3. IVF 4. PT consult, OT consult, Speech consult 5. Obtain home medications list from family. If not on an  antiplatelet agent or an anticoagulant, would start patient on ASA.  6. Neurology will sign off. Please call if there are additional questions.    @Electronically  signed: Dr. Kerney Elbe 05/14/2019, 2:07 AM

## 2019-05-14 NOTE — ED Triage Notes (Signed)
Pt bib [facility] from home d/t sudden aphasia.  Family reports pt A&O x 4.  LNK 1900 05/13/19.  Per EMS pt was nonverbal en route no unilateral weakness noted by them.

## 2019-05-14 NOTE — Code Documentation (Signed)
Responded to Code Stroke called at 0114 for aphasia and speech impediment, LSN-1900. Pt arrived at 0130, CBG-125, NIH-5, CT head negative for acute changes. Pt transported to MRI @ 0200. MRI-negative. Code Stroke cancelled at 0241.

## 2019-05-14 NOTE — ED Notes (Signed)
Family outside waiting
